# Patient Record
Sex: Female | Born: 1995 | Race: White | Hispanic: No | Marital: Single | Smoking: Never smoker
Health system: Southern US, Community
[De-identification: ages and names within clinical notes are randomized; demographics above are authoritative.]

## PROBLEM LIST (undated history)

## (undated) DIAGNOSIS — R2689 Other abnormalities of gait and mobility: Secondary | ICD-10-CM

## (undated) DIAGNOSIS — H538 Other visual disturbances: Secondary | ICD-10-CM

## (undated) DIAGNOSIS — R198 Other specified symptoms and signs involving the digestive system and abdomen: Secondary | ICD-10-CM

## (undated) DIAGNOSIS — R4789 Other speech disturbances: Secondary | ICD-10-CM

## (undated) DIAGNOSIS — B999 Unspecified infectious disease: Secondary | ICD-10-CM

## (undated) HISTORY — PX: TONSILLECTOMY AND ADENOIDECTOMY: SUR1326

## (undated) HISTORY — DX: Other visual disturbances: H53.8

## (undated) HISTORY — DX: Other abnormalities of gait and mobility: R26.89

## (undated) HISTORY — DX: Other specified symptoms and signs involving the digestive system and abdomen: R19.8

## (undated) HISTORY — DX: Unspecified infectious disease: B99.9

## (undated) HISTORY — DX: Other speech disturbances: R47.89

---
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
## 2014-04-17 ENCOUNTER — Ambulatory Visit (INDEPENDENT_AMBULATORY_CARE_PROVIDER_SITE_OTHER): Payer: BLUE CROSS/BLUE SHIELD

## 2014-04-17 ENCOUNTER — Encounter: Payer: Self-pay | Admitting: Diagnostic Neuroimaging

## 2014-04-17 ENCOUNTER — Ambulatory Visit (INDEPENDENT_AMBULATORY_CARE_PROVIDER_SITE_OTHER): Payer: BLUE CROSS/BLUE SHIELD | Admitting: Diagnostic Neuroimaging

## 2014-04-17 VITALS — BP 92/58 | HR 58 | Temp 97.9°F | Ht 67.0 in | Wt 123.8 lb

## 2014-04-17 DIAGNOSIS — R29818 Other symptoms and signs involving the nervous system: Secondary | ICD-10-CM

## 2014-04-17 DIAGNOSIS — H538 Other visual disturbances: Secondary | ICD-10-CM

## 2014-04-17 DIAGNOSIS — R2689 Other abnormalities of gait and mobility: Secondary | ICD-10-CM

## 2014-04-17 DIAGNOSIS — R4789 Other speech disturbances: Secondary | ICD-10-CM

## 2014-04-17 DIAGNOSIS — R269 Unspecified abnormalities of gait and mobility: Secondary | ICD-10-CM

## 2014-04-17 MED ORDER — GADOPENTETATE DIMEGLUMINE 469.01 MG/ML IV SOLN: 12.0000 mL | Freq: Once | INTRAVENOUS | Status: AC | PRN

## 2014-04-17 NOTE — Progress Notes (Signed)
[facility]  PATIENT: Bridget May DOB: [birth year]  REFERRING CLINICIAN: Webster HISTORY FROM: patient and mother REASON FOR VISIT: new consult    HISTORICAL  CHIEF COMPLAINT:  Chief Complaint  Patient presents with  . Neurologic Problem    slurred speech, word finding problems  . Extremity Weakness    Right side, numbness    HISTORY OF PRESENT ILLNESS:   19 year old right-handed female here for evaluation of multiple symptoms including vision problems, word finding difficulty and balance problems. May 2015 patient was having recurrent ear and sinus infections. She also developed yeast infection. She also had some type of a insect bite to her right posterior thigh, leading to right thigh redness and soreness. Around the same time patient developed onset of intermittent blurred vision, transient visual loss, lightheaded sensations. She also would have right arm and right leg numbness, weakness, right elbow and right knee pain, lasting 1 day at a time, with 10 separate episodes over the past few months. Also patient having intermittent word finding difficulties, blank memory spells and difficulty in conversations. Patient is noted she is having more difficulty with vision in her right eye. Generally her balance or coordination has declined and she has some difficulty with quick movements and sports-related activities.  Patient also struggling with urinary and bowel control problems, abdominal pain, and is being evaluated by GI specialist.  Patient has history of multiple head injuries and concussions in high school playing lacrosse, with headache, dizziness, nausea and vomiting. Apparently she had full recovery after each of these concussions. She has not had any school or academic difficulties.   REVIEW OF SYSTEMS: Full 14 system review of systems performed and notable only for weight loss fatigue and vision feeling hot increased thirst joint pain aching muscles  frequent infections urination problem decreased energy tremor passing out dizziness slurred speech numbness memory loss sleepiness.    ALLERGIES: No Known Allergies  HOME MEDICATIONS: Outpatient Prescriptions Prior to Visit  Medication Sig Dispense Refill  . clarithromycin (BIAXIN XL) 500 MG 24 hr tablet Take 1 tablet by mouth 2 (two) times daily. For 10 days    . dicyclomine (BENTYL) 20 MG tablet Take 1 tablet (20 mg total) by mouth 2 (two) times daily. 20 tablet 0  . fluconazole (DIFLUCAN) 150 MG tablet Take 1 tablet by mouth See admin instructions. Now, may repeat in 3 days    . ibuprofen (ADVIL,MOTRIN) 200 MG tablet Take 400 mg by mouth every 6 (six) hours as needed for moderate pain.    . LO LOESTRIN FE 1 MG-10 MCG / 10 MCG tablet Take 1 tablet by mouth daily.    . mupirocin ointment (BACTROBAN) 2 % Apply 1-2 application topically daily as needed (to bite on leg).      No facility-administered medications prior to visit.    PAST MEDICAL HISTORY: Past Medical History  Diagnosis Date  . GI problem   . Infection     several infections in  2015    PAST SURGICAL HISTORY: Past Surgical History  Procedure Laterality Date  . Tonsillectomy and adenoidectomy      FAMILY HISTORY: Family History  Problem Relation Age of Onset  . Dementia Maternal Grandmother   . Heart disease Maternal Grandfather   . Cancer Paternal Grandmother     SOCIAL HISTORY:  History   Social History  . Marital Status: Single    Spouse Name: N/A    Number of Children: 0  . Years of Education: [employer]  Occupational History  .  Other    n/a   Social History Main Topics  . Smoking status: Never Smoker   . Smokeless tobacco: Never Used  . Alcohol Use: No  . Drug Use: No  . Sexual Activity: Yes    Birth Control/ Protection: Pill   Other Topics Concern  . Not on file   Social History Narrative   Patient lives at home with roommate.   Caffeine Use: 1 cup daily     PHYSICAL  EXAM  Filed Vitals:   04/17/14 0908  BP: 92/58  Pulse: 58  Temp: 97.9 F (36.6 C)  TempSrc: Oral  Height: 5\' 7"  (1.702 m)  Weight: 123 lb 12.8 oz (56.155 kg)    Body mass index is 19.39 kg/(m^2).   Visual Acuity Screening   Right eye Left eye Both eyes  Without correction: 20/100 20/30   With correction:       No flowsheet data found.  GENERAL EXAM: Patient is in no distress; well developed, nourished and groomed; neck is supple  CARDIOVASCULAR: Regular rate and rhythm, no murmurs, no carotid bruits  NEUROLOGIC: MENTAL STATUS: awake, alert, oriented to person, place and time, recent and remote memory intact, normal attention and concentration, language fluent, comprehension intact, naming intact, fund of knowledge appropriate CRANIAL NERVE: no papilledema on fundoscopic exam, SUBTLE RIGHT AFFERENT PUPILLARY DEFECT; INCREASED LIGHT SENS IN RIGHT EYE; visual fields full to confrontation, extraocular muscles intact, no nystagmus, facial sensation and strength symmetric, hearing intact, palate elevates symmetrically, uvula midline, shoulder shrug symmetric, tongue midline. MOTOR: normal bulk and tone, full strength in the BUE, BLE SENSORY: normal and symmetric to light touch, pinprick, temperature, vibration COORDINATION: finger-nose-finger, fine finger movements, heel-shin normal REFLEXES: deep tendon reflexes present and symmetric; BRISK AT KNEES GAIT/STATION: narrow based gait; able to walk on toes, heels; DIFF WITH TANDEM; romberg is negative    DIAGNOSTIC DATA (LABS, IMAGING, TESTING) - I reviewed patient records, labs, notes, testing and imaging myself where available.  Lab Results  Component Value Date   WBC 12.3* 09/06/2013   HGB 11.5* 09/06/2013   HCT 34.3* 09/06/2013   MCV 93.2 09/06/2013   PLT 284 09/06/2013      Component Value Date/Time   NA 138 09/06/2013 0415   K 3.9 09/06/2013 0415   CL 102 09/06/2013 0415   CO2 23 09/06/2013 0415   GLUCOSE 108*  09/06/2013 0415   BUN 17 09/06/2013 0415   CREATININE 0.83 09/06/2013 0415   CALCIUM 9.0 09/06/2013 0415   GFRNONAA >90 09/06/2013 0415   GFRAA >90 09/06/2013 0415   No results found for: CHOL, HDL, LDLCALC, LDLDIRECT, TRIG, CHOLHDL No results found for: [id number] No results found for: VITAMINB12 No results found for: TSH     ASSESSMENT AND PLAN  19 y.o. year old female here with constellation of neurologic symptoms including transient visual loss, right eye blurred vision, balance difficulty, transient word finding difficulty, right arm and right leg numbness/weakness. Neurologic examination notable for decreased visual acuity in right eye, increased light sensitivity in right eye, relative afferent pupil or defect in the right eye, difficulty with tandem gait. Will proceed with further evaluation.   Ddx: CNS autoimmune, inflamm, infx, metabolic  PLAN: - MRI brain - labs for tick borne dz  Orders Placed This Encounter  Procedures  . MR Brain W Wo Contrast  . Lyme, Total Ab Test/Reflex  . Rocky mtn spotted fvr abs pnl(IgG+IgM)   Return in about 3 months (around 07/17/2014).  Suanne Marker, MD 04/17/2014, 10:26 AM Certified in Neurology, Neurophysiology and Neuroimaging  [facility] [address], [address] [place], [place] [address] [phone]

## 2014-04-17 NOTE — Patient Instructions (Signed)
I will check additional testing. 

## 2014-04-20 LAB — ROCKY MTN SPOTTED FVR ABS PNL(IGG+IGM)
RMSF IGG: NEGATIVE
RMSF IgM: 0.3 index (ref 0.00–0.89)

## 2014-04-20 LAB — LYME, TOTAL AB TEST/REFLEX: Lyme IgG/IgM Ab: <0.91 {ISR} (ref 0.00–0.90)

## 2014-07-17 ENCOUNTER — Telehealth: Payer: Self-pay | Admitting: *Deleted

## 2014-07-17 ENCOUNTER — Telehealth: Payer: Self-pay | Admitting: Diagnostic Neuroimaging

## 2014-07-17 NOTE — Telephone Encounter (Signed)
Tried to call and leave a message but the VM was full. If pt calls back, please reschedule her appt. Dr. Demetrius CharityP will not be in the office 08/16/14. Thanks

## 2014-07-17 NOTE — Telephone Encounter (Signed)
Patient is calling to get MRI results from January. Please call.

## 2014-07-17 NOTE — Telephone Encounter (Signed)
If the pt calls back, let her know her MRI was normal. Also let her know her voicemail box was full and I was unable to leave her a message. Thanks

## 2014-08-16 ENCOUNTER — Ambulatory Visit: Payer: BLUE CROSS/BLUE SHIELD | Admitting: Diagnostic Neuroimaging

## 2015-05-08 IMAGING — CR DG ABDOMEN ACUTE W/ 1V CHEST
3 series · 3 of 3 positions shown · non-contrast
Comparison: None.

CLINICAL DATA: Severe abdominal pain.

EXAM:
ACUTE ABDOMEN SERIES (ABDOMEN 2 VIEW & CHEST 1 VIEW)

[w chest pa]
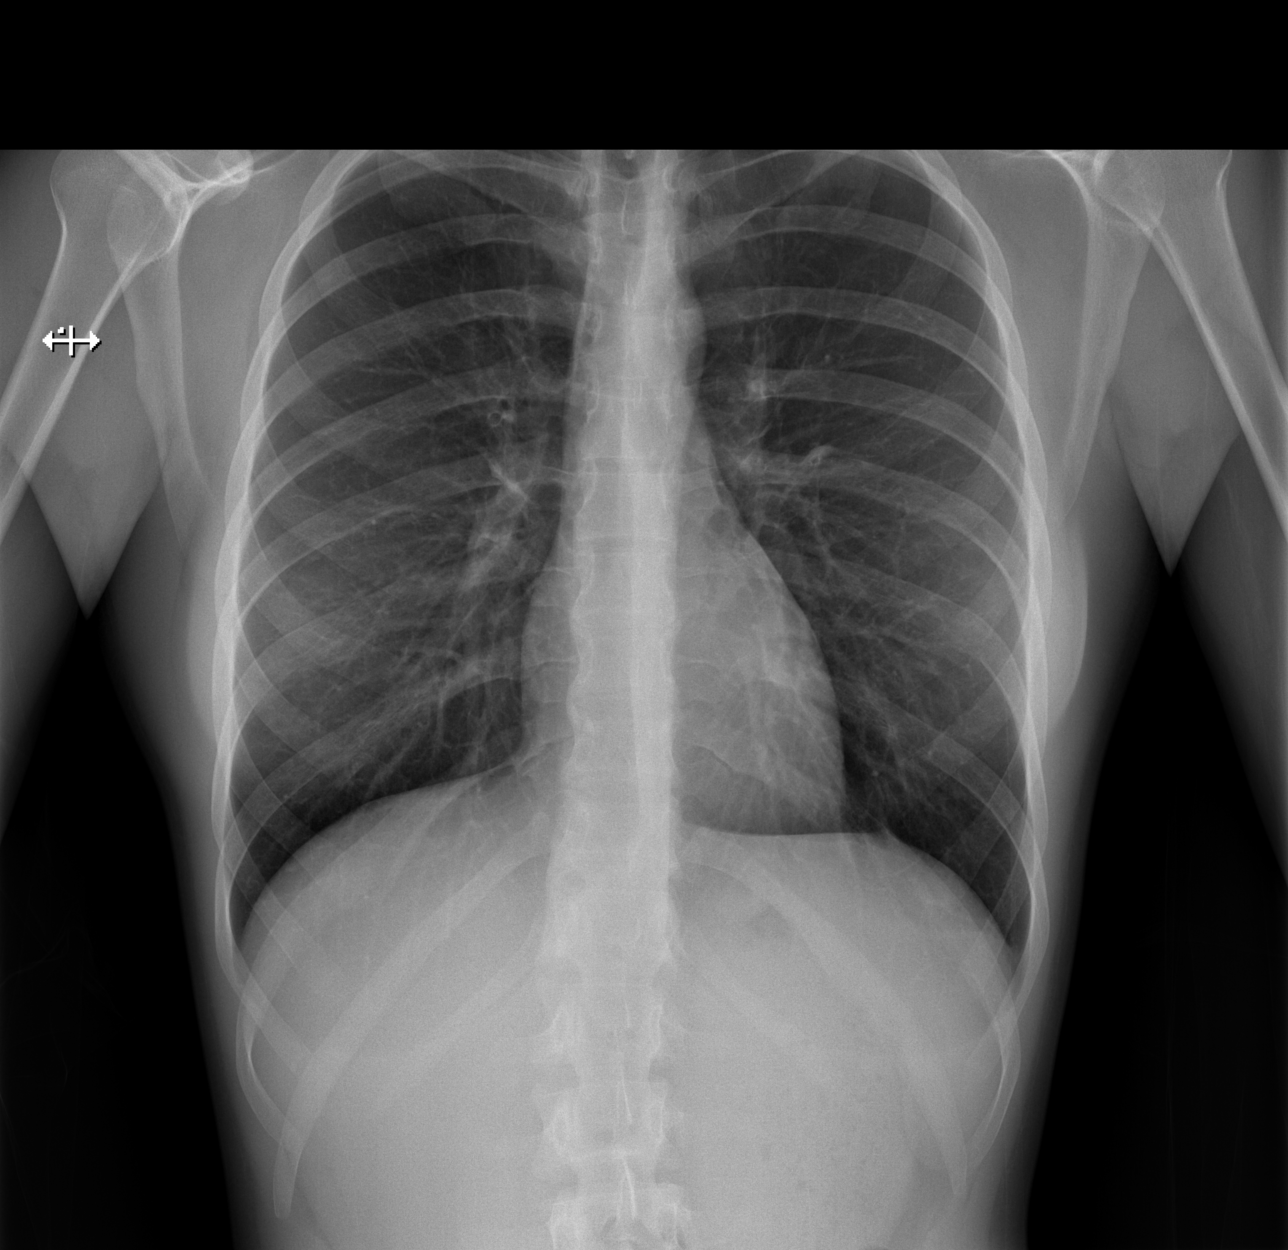

[w abdomen upright]
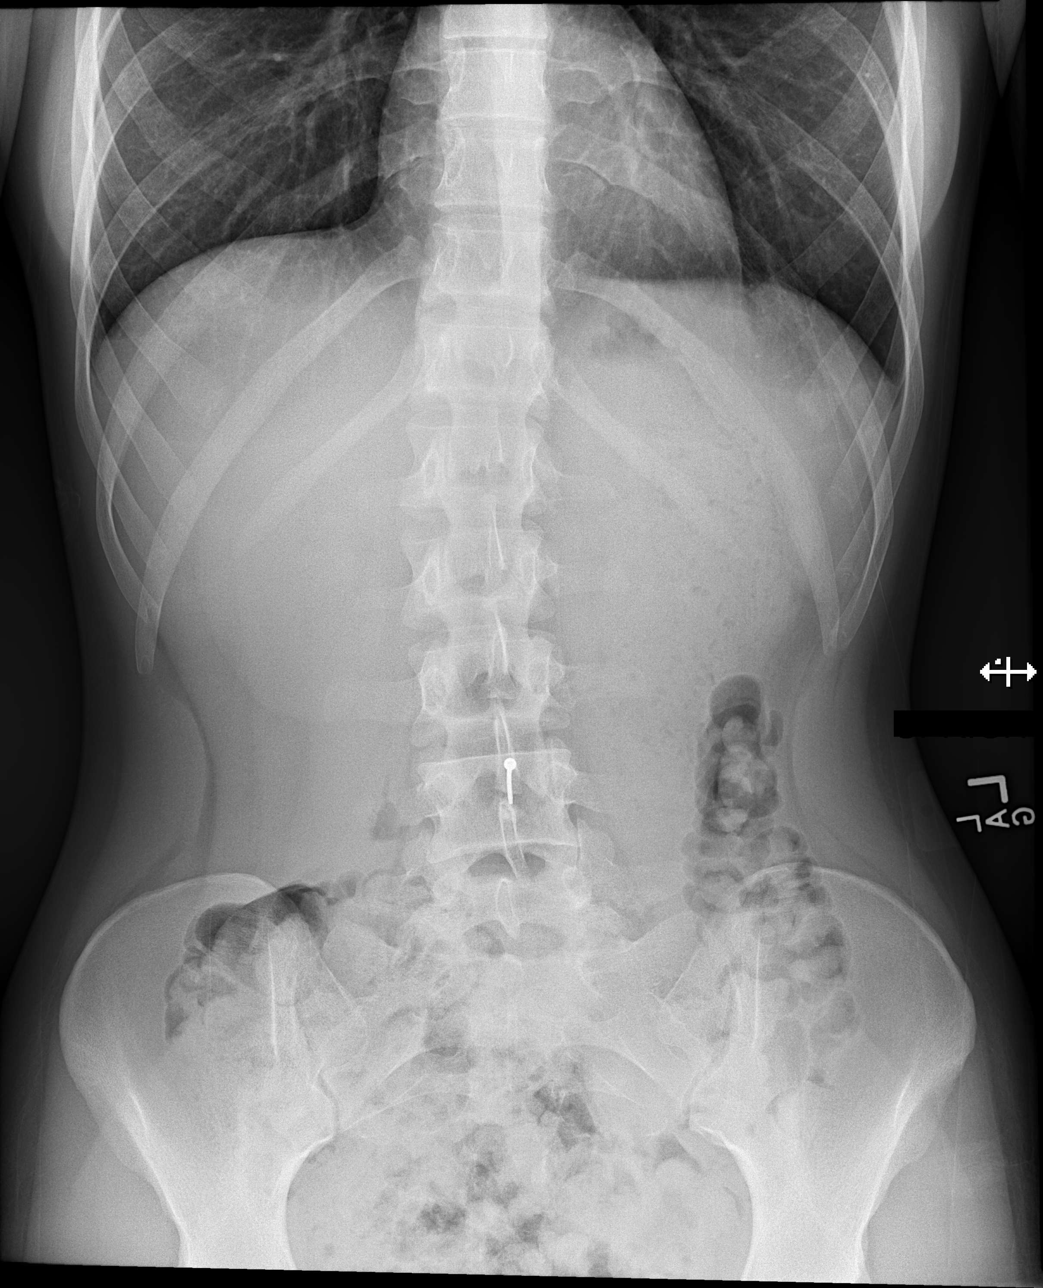

[t abdomen supine]
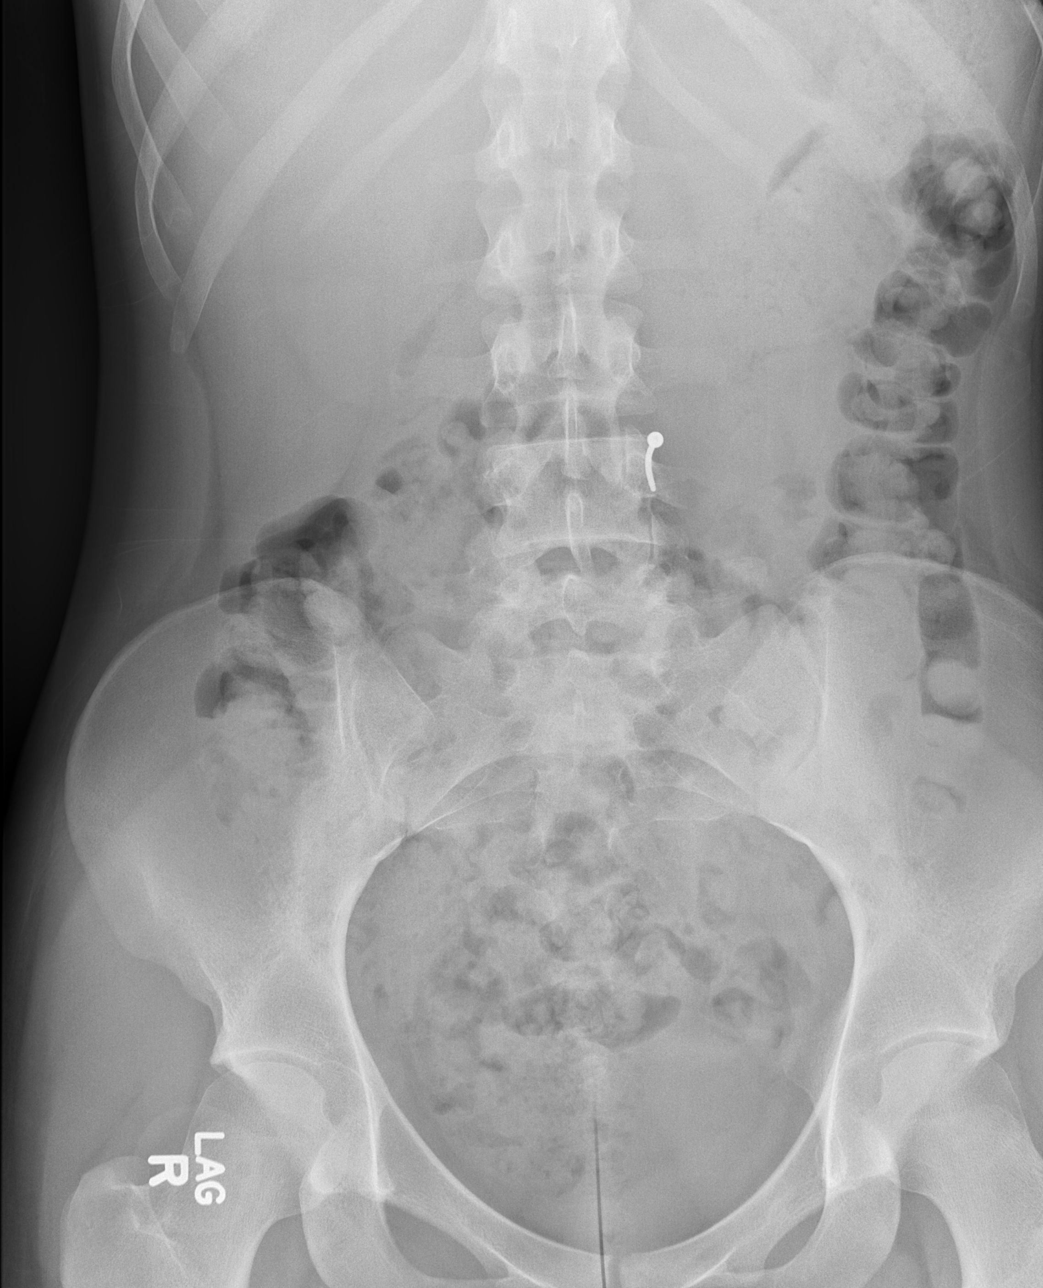

[3 of 3 positions shown; findings below may reference images not displayed]

FINDINGS: The lungs are well-aerated and clear. There is no evidence of focal
opacification, pleural effusion or pneumothorax. The
cardiomediastinal silhouette is within normal limits.

The visualized bowel gas pattern is unremarkable. Scattered stool
and air are seen within the colon; there is no evidence of small
bowel dilatation to suggest obstruction. No free intra-abdominal air
is identified on the provided upright view.

No acute osseous abnormalities are seen; the sacroiliac joints are
unremarkable in appearance. A metallic piercing is noted at the
umbilicus.
IMPRESSION: 1. Unremarkable bowel gas pattern; no free intra-abdominal air seen.
Small to moderate amount of stool noted in the colon.
2. No acute cardiopulmonary process identified.
